# Patient Record
Sex: Male | Born: 2000 | Race: White | Hispanic: No | Marital: Single | Smoking: Never smoker
Health system: Southern US, Community
[De-identification: ages and names within clinical notes are randomized; demographics above are authoritative.]

## PROBLEM LIST (undated history)

## (undated) DIAGNOSIS — R519 Headache, unspecified: Secondary | ICD-10-CM

## (undated) DIAGNOSIS — R51 Headache: Secondary | ICD-10-CM

## (undated) DIAGNOSIS — F988 Other specified behavioral and emotional disorders with onset usually occurring in childhood and adolescence: Secondary | ICD-10-CM

## (undated) HISTORY — DX: Other specified behavioral and emotional disorders with onset usually occurring in childhood and adolescence: F98.8

## (undated) HISTORY — DX: Headache: R51

## (undated) HISTORY — DX: Headache, unspecified: R51.9

## (undated) HISTORY — PX: CIRCUMCISION: SUR203

---
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
## 2022-01-12 ENCOUNTER — Ambulatory Visit (INDEPENDENT_AMBULATORY_CARE_PROVIDER_SITE_OTHER): Payer: BC Managed Care – PPO | Admitting: Family Medicine

## 2022-01-12 ENCOUNTER — Encounter: Payer: Self-pay | Admitting: Family Medicine

## 2022-01-12 VITALS — BP 146/77 | HR 113 | Temp 99.4°F | Ht 69.05 in | Wt 283.2 lb

## 2022-01-12 DIAGNOSIS — R0981 Nasal congestion: Secondary | ICD-10-CM | POA: Diagnosis not present

## 2022-01-12 DIAGNOSIS — R1319 Other dysphagia: Secondary | ICD-10-CM

## 2022-01-12 DIAGNOSIS — I10 Essential (primary) hypertension: Secondary | ICD-10-CM

## 2022-01-12 MED ORDER — MOMETASONE FUROATE 50 MCG/ACT NA SUSP: 2.0000 | Freq: Every day | NASAL | 12 refills | Status: DC

## 2022-01-12 MED ORDER — ESOMEPRAZOLE MAGNESIUM 40 MG PO CPDR: 40.0000 mg | DELAYED_RELEASE_CAPSULE | Freq: Every day | ORAL | 3 refills | Status: DC

## 2022-01-12 MED ORDER — AMLODIPINE BESYLATE 5 MG PO TABS: 5.0000 mg | ORAL_TABLET | Freq: Every day | ORAL | 1 refills | Status: DC

## 2022-01-12 NOTE — Progress Notes (Addendum)
Subjective:  Patient ID: Ryan Sanford, male    DOB: October 22, 2001  Age: 21 y.o. MRN: [id number]  CC: No chief complaint on file.   HPI Ryan Sanford presents for feeling that something is caught in his throat when he swallows food.  He is having a great deal of reflux and has been diagnosed with a hiatal hernia.  Symptoms seem to be getting worse.  He started several months ago. Mom says also that he breathes very heavy through his nose and has a great deal of sinus drainage chronically.  Symptoms include congestion, non productive cough, post nasal drip and sinus pressure. There is no fever, chills, or sweats. Drainage causing cough Trsted for BP with amlodipine. Off med fr several weeks.   Depression screen [facility] 2/9 01/12/2022 11/09/2020 01/20/2020  Decreased Interest 0 0 0  Down, Depressed, Hopeless 0 0 0  PHQ - 2 Score 0 0 0  Altered sleeping - - -  Tired, decreased energy - - -  Change in appetite - - -  Feeling bad or failure about yourself  - - -  Trouble concentrating - - -  Moving slowly or fidgety/restless - - -  Suicidal thoughts - - -  PHQ-9 Score - - -    History Ryan Sanford has a past medical history of ADD (attention deficit disorder) and Headache.   He has a past surgical history that includes Circumcision (2004).   His family history includes Diabetes in his father and paternal grandmother; Heart failure in his paternal grandfather; Kidney failure in his father.He reports that he has never smoked. He has never used smokeless tobacco. He reports that he does not drink alcohol and does not use drugs.    ROS Review of Systems  Constitutional:  Negative for fever.  HENT:  Positive for postnasal drip, sore throat and trouble swallowing.   Respiratory:  Negative for shortness of breath.   Cardiovascular:  Negative for chest pain.  Musculoskeletal:  Negative for arthralgias.  Skin:  Negative for rash.   Objective:  BP (!) 146/77    Pulse (!) 113    Temp 99.4 F (37.4 C)     Ht 5' 9.05" (1.754 m)    Wt 283 lb 3.2 oz (128.5 kg)    SpO2 98%    BMI 41.76 kg/m   BP Readings from Last 3 Encounters:  01/12/22 (!) 146/77  10/07/21 139/84  11/09/20 (!) 156/93 (>99 %, Z >2.33 /  99 %, Z = 2.33)*   *BP percentiles are based on the 2017 AAP Clinical Practice Guideline for boys    Wt Readings from Last 3 Encounters:  01/12/22 283 lb 3.2 oz (128.5 kg) (>99 %, Z= 2.86)*  10/07/21 276 lb 6 oz (125.4 kg) (>99 %, Z= 2.79)*  11/09/20 (!) 265 lb (120.2 kg) (>99 %, Z= 2.75)*   * Growth percentiles are based on CDC (Boys, 2-20 Years) data.     Physical Exam Constitutional:      General: He is not in acute distress.    Appearance: He is well-developed.  HENT:     Head: Normocephalic and atraumatic.     Right Ear: External ear normal.     Left Ear: External ear normal.     Nose: Nose normal.  Eyes:     Conjunctiva/sclera: Conjunctivae normal.     Pupils: Pupils are equal, round, and reactive to light.  Cardiovascular:     Rate and Rhythm: Normal rate and regular rhythm.  Heart sounds: Normal heart sounds. No murmur heard. Pulmonary:     Effort: Pulmonary effort is normal. No respiratory distress.     Breath sounds: Normal breath sounds. No wheezing or rales.  Abdominal:     Palpations: Abdomen is soft.     Tenderness: There is no abdominal tenderness.  Musculoskeletal:        General: Normal range of motion.     Cervical back: Normal range of motion and neck supple.  Skin:    General: Skin is warm and dry.  Neurological:     Mental Status: He is alert and oriented to person, place, and time.     Deep Tendon Reflexes: Reflexes are normal and symmetric.  Psychiatric:        Behavior: Behavior normal.        Thought Content: Thought content normal.        Judgment: Judgment normal.      Assessment & Plan:   Diagnoses and all orders for this visit:  Essential hypertension -     CBC with Differential/Platelet -     Lipid panel -     TSH -      CMP14+EGFR  Esophageal dysphagia -     CBC with Differential/Platelet -     Lipid panel -     TSH -     CMP14+EGFR -     esomeprazole (NEXIUM) 40 MG capsule; Take 1 capsule (40 mg total) by mouth daily. -     Cancel: Ambulatory referral to ENT -     Ambulatory referral to ENT  Sinus congestion -     CBC with Differential/Platelet -     Lipid panel -     TSH -     CMP14+EGFR -     mometasone (NASONEX) 50 MCG/ACT nasal spray; Place 2 sprays into the nose daily. -     Ambulatory referral to ENT  Primary hypertension -     amLODipine (NORVASC) 5 MG tablet; Take 1 tablet (5 mg total) by mouth daily.   Resume amlodipine Monitor BP at home, at rest, for goal <130/80. Will increase to 10 mg if goalnot met    I am having Ryan Sanford start on esomeprazole and mometasone. I am also having him maintain his amLODipine.  Allergies as of 01/12/2022   No Known Allergies      Medication List        Accurate as of January 12, 2022 11:59 PM. If you have any questions, ask your nurse or doctor.          amLODipine 5 MG tablet Commonly known as: NORVASC Take 1 tablet (5 mg total) by mouth daily.   esomeprazole 40 MG capsule Commonly known as: NexIUM Take 1 capsule (40 mg total) by mouth daily. Started by: Claretta Fraise, MD   mometasone 50 MCG/ACT nasal spray Commonly known as: Nasonex Place 2 sprays into the nose daily. Started by: Claretta Fraise, MD         Follow-up: Return in about 3 months (around 04/11/2022).  Claretta Fraise, M.D.

## 2022-01-13 LAB — LIPID PANEL
Chol/HDL Ratio: 6.1 ratio — ABNORMAL HIGH (ref 0.0–5.0)
Cholesterol, Total: 177 mg/dL — ABNORMAL HIGH (ref 100–169)
HDL: 29 mg/dL — ABNORMAL LOW (ref >39)
LDL Chol Calc (NIH): 68 mg/dL (ref 0–109)
Triglycerides: 518 mg/dL — ABNORMAL HIGH (ref 0–89)
VLDL Cholesterol Cal: 80 mg/dL — ABNORMAL HIGH (ref 5–40)

## 2022-01-13 LAB — CBC WITH DIFFERENTIAL/PLATELET
Basophils Absolute: 0 10*3/uL (ref 0.0–0.2)
Basos: 1 %
EOS (ABSOLUTE): 0.2 10*3/uL (ref 0.0–0.4)
Eos: 2 %
Hematocrit: 42.4 % (ref 37.5–51.0)
Hemoglobin: 14.4 g/dL (ref 13.0–17.7)
Immature Grans (Abs): 0 10*3/uL (ref 0.0–0.1)
Immature Granulocytes: 0 %
Lymphocytes Absolute: 2.7 10*3/uL (ref 0.7–3.1)
Lymphs: 30 %
MCH: 28.3 pg (ref 26.6–33.0)
MCHC: 34 g/dL (ref 31.5–35.7)
MCV: 84 fL (ref 79–97)
Monocytes Absolute: 0.5 10*3/uL (ref 0.1–0.9)
Monocytes: 6 %
Neutrophils Absolute: 5.4 10*3/uL (ref 1.4–7.0)
Neutrophils: 61 %
Platelets: 419 10*3/uL (ref 150–450)
RBC: 5.08 x10E6/uL (ref 4.14–5.80)
RDW: 13 % (ref 11.6–15.4)
WBC: 8.9 10*3/uL (ref 3.4–10.8)

## 2022-01-13 LAB — CMP14+EGFR
ALT: 9 IU/L (ref 0–44)
AST: 17 IU/L (ref 0–40)
Albumin/Globulin Ratio: 1.7 (ref 1.2–2.2)
Albumin: 4.6 g/dL (ref 4.1–5.2)
Alkaline Phosphatase: 68 IU/L (ref 51–125)
BUN/Creatinine Ratio: 10 (ref 9–20)
BUN: 7 mg/dL (ref 6–20)
Bilirubin Total: 0.2 mg/dL (ref 0.0–1.2)
CO2: 20 mmol/L (ref 20–29)
Calcium: 9.8 mg/dL (ref 8.7–10.2)
Chloride: 104 mmol/L (ref 96–106)
Creatinine, Ser: 0.72 mg/dL — ABNORMAL LOW (ref 0.76–1.27)
Globulin, Total: 2.7 g/dL (ref 1.5–4.5)
Glucose: 111 mg/dL — ABNORMAL HIGH (ref 70–99)
Potassium: 4.4 mmol/L (ref 3.5–5.2)
Sodium: 143 mmol/L (ref 134–144)
Total Protein: 7.3 g/dL (ref 6.0–8.5)
eGFR: 136 mL/min/{1.73_m2} (ref >59)

## 2022-01-13 LAB — TSH: TSH: 2.02 u[IU]/mL (ref 0.450–4.500)

## 2022-01-14 ENCOUNTER — Encounter: Payer: Self-pay | Admitting: Family Medicine

## 2022-01-14 NOTE — Addendum Note (Signed)
Addended by: Claretta Fraise on: 01/14/2022 10:45 PM   Modules accepted: Orders

## 2022-04-27 ENCOUNTER — Ambulatory Visit: Payer: BC Managed Care – PPO | Admitting: Family Medicine

## 2022-04-27 ENCOUNTER — Encounter: Payer: Self-pay | Admitting: Family Medicine

## 2022-04-27 DIAGNOSIS — I10 Essential (primary) hypertension: Secondary | ICD-10-CM

## 2022-04-27 MED ORDER — AMLODIPINE BESYLATE 10 MG PO TABS: 10.0000 mg | ORAL_TABLET | Freq: Every day | ORAL | 3 refills | Status: DC

## 2022-04-27 NOTE — Progress Notes (Signed)
? ?Subjective:  ?Patient ID: Ryan Sanford, male    DOB: 04/15/2001  Age: 21 y.o. MRN: [id number] ? ?CC: Hypertension ? ? ?HPI ?Ryan Sanford presents for continued elevated BP. Amlodipine agrees with Ryan Sanford. No adverse effects. Also, heartburn has resolved. Working on weight loss. Walking a mile every morning. Eating better. Mom is helping with this.  ? ? ?  04/27/2022  ? 10:26 AM 01/12/2022  ?  3:41 PM 11/09/2020  ?  1:09 PM  ?Depression screen PHQ 2/9  ?Decreased Interest 0 0 0  ?Down, Depressed, Hopeless 0 0 0  ?PHQ - 2 Score 0 0 0  ? ? ?History ?Ryan Sanford has a past medical history of ADD (attention deficit disorder) and Headache.  ? ?Ryan Sanford has a past surgical history that includes Circumcision (2004).  ? ?Ryan Sanford family history includes Diabetes in Ryan Sanford father and paternal grandmother; Heart failure in Ryan Sanford paternal grandfather; Kidney failure in Ryan Sanford father.Ryan Sanford reports that Ryan Sanford has never smoked. Ryan Sanford has never used smokeless tobacco. Ryan Sanford reports that Ryan Sanford does not drink alcohol and does not use drugs. ? ? ? ?ROS ?Review of Systems  ?Constitutional:  Negative for fever.  ?Respiratory:  Negative for shortness of breath.   ?Cardiovascular:  Negative for chest pain.  ?Musculoskeletal:  Negative for arthralgias.  ?Skin:  Negative for rash.  ? ?Objective:  ?BP 131/81   Pulse (!) 102   Temp 99.2 ?F (37.3 ?C)   Ht 5' 9.09" (1.755 m)   Wt 275 lb 9.6 oz (125 kg)   SpO2 97%   BMI 40.59 kg/m?  ? ?BP Readings from Last 3 Encounters:  ?04/27/22 131/81  ?01/12/22 (!) 146/77  ?10/07/21 139/84  ? ? ?Wt Readings from Last 3 Encounters:  ?04/27/22 275 lb 9.6 oz (125 kg) (>99 %, Z= 2.77)*  ?01/12/22 283 lb 3.2 oz (128.5 kg) (>99 %, Z= 2.86)*  ?10/07/21 276 lb 6 oz (125.4 kg) (>99 %, Z= 2.79)*  ? ?* Growth percentiles are based on CDC (Boys, 2-20 Years) data.  ? ? ? ?Physical Exam ?Vitals reviewed.  ?Constitutional:   ?   Appearance: Ryan Sanford is well-developed.  ?HENT:  ?   Head: Normocephalic and atraumatic.  ?   Right Ear: External ear normal.  ?   Left  Ear: External ear normal.  ?   Mouth/Throat:  ?   Pharynx: No oropharyngeal exudate or posterior oropharyngeal erythema.  ?Eyes:  ?   Pupils: Pupils are equal, round, and reactive to light.  ?Cardiovascular:  ?   Rate and Rhythm: Normal rate and regular rhythm.  ?   Heart sounds: No murmur heard. ?Pulmonary:  ?   Effort: No respiratory distress.  ?   Breath sounds: Normal breath sounds.  ?Musculoskeletal:  ?   Cervical back: Normal range of motion and neck supple.  ?Neurological:  ?   Mental Status: Ryan Sanford is alert and oriented to person, place, and time.  ? ? ? ? ?Assessment & Plan:  ? ?Ryan Sanford was seen today for hypertension. ? ?Diagnoses and all orders for this visit: ? ?Primary hypertension ?-     amLODipine (NORVASC) 10 MG tablet; Take 1 tablet (10 mg total) by mouth daily. ? ? ? ? ? ? ?I have discontinued Ryan Sanford's esomeprazole and mometasone. I have also changed Ryan Sanford amLODipine. ? ?Allergies as of 04/27/2022   ?No Known Allergies ?  ? ?  ?Medication List  ?  ? ?  ? Accurate as of Apr 27, 2022  9:44  PM. If you have any questions, ask your nurse or doctor.  ?  ?  ? ?  ? ?STOP taking these medications   ? ?esomeprazole 40 MG capsule ?Commonly known as: NexIUM ?Stopped by: Mechele Claude, MD ?  ?mometasone 50 MCG/ACT nasal spray ?Commonly known as: Nasonex ?Stopped by: Mechele Claude, MD ?  ? ?  ? ?TAKE these medications   ? ?amLODipine 10 MG tablet ?Commonly known as: NORVASC ?Take 1 tablet (10 mg total) by mouth daily. ?What changed:  ?medication strength ?how much to take ?Changed by: Mechele Claude, MD ?  ? ?  ? ? ? ?Follow-up: Return in about 3 months (around 07/28/2022). ? ?Mechele Claude, M.D. ?

## 2023-05-31 ENCOUNTER — Ambulatory Visit: Payer: BC Managed Care – PPO | Admitting: Family Medicine

## 2023-07-03 ENCOUNTER — Ambulatory Visit: Payer: BC Managed Care – PPO | Admitting: Family Medicine

## 2023-07-03 ENCOUNTER — Encounter: Payer: Self-pay | Admitting: Family Medicine

## 2023-07-03 VITALS — BP 120/78 | HR 96 | Temp 97.9°F | Ht 69.09 in | Wt 314.2 lb

## 2023-07-03 DIAGNOSIS — R0981 Nasal congestion: Secondary | ICD-10-CM

## 2023-07-03 DIAGNOSIS — I10 Essential (primary) hypertension: Secondary | ICD-10-CM

## 2023-07-03 MED ORDER — AMLODIPINE BESYLATE 10 MG PO TABS: 10.0000 mg | ORAL_TABLET | Freq: Every day | ORAL | 3 refills | Status: AC

## 2023-07-03 NOTE — Progress Notes (Signed)
Subjective:  Patient ID: Ryan Sanford, male    DOB: 2001-05-14  Age: 22 y.o. MRN: [id number]  CC: Medical Management of Chronic Issues   HPI JAYZIAH BANKHEAD presents for  follow-up of hypertension. Patient has no history of headache chest pain or shortness of breath or recent cough. Patient also denies symptoms of TIA such as focal numbness or weakness. Patient denies side effects from medication. States taking it regularly, but ran out several months ago. Has been running high.   Now working in [employer]. Driving to [place] [facility] daily, but about to switch to [place].Current drive is interfering with his diet and exercise regimen.   He is engaged to be married. Girlfriend in college. Waiting until she graduates for wedding. That is 2 more years.    History Kyen has a past medical history of ADD (attention deficit disorder) and Headache.   He has a past surgical history that includes Circumcision (2004).   His family history includes Diabetes in his father and paternal grandmother; Heart failure in his paternal grandfather; Kidney failure in his father.He reports that he has never smoked. He has never used smokeless tobacco. He reports that he does not drink alcohol and does not use drugs.  No current outpatient medications on file prior to visit.   No current facility-administered medications on file prior to visit.    ROS Review of Systems  Constitutional:  Negative for fever.  HENT:  Positive for congestion and postnasal drip.   Respiratory:  Negative for shortness of breath.   Cardiovascular:  Negative for chest pain.  Musculoskeletal:  Negative for arthralgias.  Skin:  Negative for rash.    Objective:  BP 120/78   Pulse 96   Temp 97.9 F (36.6 C)   Ht 5' 9.09" (1.755 m)   Wt (!) 314 lb 3.2 oz (142.5 kg)   SpO2 100%   BMI 46.28 kg/m   BP Readings from Last 3 Encounters:  07/03/23 120/78  04/27/22 131/81  01/12/22 (!) 146/77    Wt Readings from Last 3  Encounters:  07/03/23 (!) 314 lb 3.2 oz (142.5 kg)  04/27/22 275 lb 9.6 oz (125 kg) (>99%, Z= 2.77)*  01/12/22 283 lb 3.2 oz (128.5 kg) (>99%, Z= 2.86)*   * Growth percentiles are based on CDC (Boys, 2-20 Years) data.     Physical Exam Vitals reviewed.  Constitutional:      Appearance: He is well-developed.  HENT:     Head: Normocephalic and atraumatic.     Right Ear: External ear normal.     Left Ear: External ear normal.     Mouth/Throat:     Pharynx: No oropharyngeal exudate or posterior oropharyngeal erythema.  Eyes:     Pupils: Pupils are equal, round, and reactive to light.  Cardiovascular:     Rate and Rhythm: Normal rate and regular rhythm.     Heart sounds: No murmur heard. Pulmonary:     Effort: No respiratory distress.     Breath sounds: Normal breath sounds.  Musculoskeletal:     Cervical back: Normal range of motion and neck supple.  Neurological:     Mental Status: He is alert and oriented to person, place, and time.       Assessment & Plan:   [place] was seen today for medical management of chronic issues.  Diagnoses and all orders for this visit:  Nasal congestion -     Ambulatory referral to ENT  Primary hypertension -  amLODipine (NORVASC) 10 MG tablet; Take 1 tablet (10 mg total) by mouth daily. -     CMP14+EGFR -     CBC with Differential/Platelet   Allergies as of 07/03/2023   No Known Allergies      Medication List        Accurate as of July 03, 2023  2:42 PM. If you have any questions, ask your nurse or doctor.          amLODipine 10 MG tablet Commonly known as: NORVASC Take 1 tablet (10 mg total) by mouth daily.        Meds ordered this encounter  Medications   amLODipine (NORVASC) 10 MG tablet    Sig: Take 1 tablet (10 mg total) by mouth daily.    Dispense:  90 tablet    Refill:  3      Follow-up: Return in about 1 year (around 07/02/2024) for [facility] physical.  Mechele Claude, M.D.

## 2024-03-19 DIAGNOSIS — M25461 Effusion, right knee: Secondary | ICD-10-CM | POA: Diagnosis not present

## 2024-03-19 DIAGNOSIS — M25561 Pain in right knee: Secondary | ICD-10-CM | POA: Diagnosis not present

## 2024-03-19 DIAGNOSIS — M7041 Prepatellar bursitis, right knee: Secondary | ICD-10-CM | POA: Diagnosis not present

## 2024-07-03 ENCOUNTER — Encounter: Payer: BC Managed Care – PPO | Admitting: Family Medicine
